# Patient Record
Sex: Male | Born: 1990 | Race: White | Hispanic: No | Marital: Single | Smoking: Current some day smoker
Health system: Southern US, Community
[De-identification: ages and names within clinical notes are randomized; demographics above are authoritative.]

## PROBLEM LIST (undated history)

## (undated) ENCOUNTER — Emergency Department (HOSPITAL_COMMUNITY): Discharge status: Against Medical Advice | Payer: Self-pay

---

## 2009-10-29 ENCOUNTER — Emergency Department (HOSPITAL_BASED_OUTPATIENT_CLINIC_OR_DEPARTMENT_OTHER): Admission: EM | Admit: 2009-10-29 | Discharge: 2009-10-29 | Payer: Self-pay | Admitting: Emergency Medicine

## 2010-06-21 ENCOUNTER — Emergency Department (HOSPITAL_BASED_OUTPATIENT_CLINIC_OR_DEPARTMENT_OTHER): Admission: EM | Admit: 2010-06-21 | Discharge: 2010-06-22 | Payer: Self-pay | Admitting: Emergency Medicine

## 2010-06-27 ENCOUNTER — Emergency Department (HOSPITAL_BASED_OUTPATIENT_CLINIC_OR_DEPARTMENT_OTHER)
Admission: EM | Admit: 2010-06-27 | Discharge: 2010-06-27 | Payer: Self-pay | Source: Home / Self Care | Admitting: Emergency Medicine

## 2010-10-11 LAB — URINALYSIS, ROUTINE W REFLEX MICROSCOPIC
Bilirubin Urine: NEGATIVE
Hgb urine dipstick: NEGATIVE
Ketones, ur: NEGATIVE mg/dL
Urobilinogen, UA: 1 mg/dL (ref 0.0–1.0)

## 2011-05-30 ENCOUNTER — Encounter: Payer: Self-pay | Admitting: Emergency Medicine

## 2011-05-30 ENCOUNTER — Emergency Department (HOSPITAL_COMMUNITY)
Admission: EM | Admit: 2011-05-30 | Discharge: 2011-05-30 | Disposition: A | Discharge status: Home / Self Care | Payer: Self-pay | Attending: Emergency Medicine | Admitting: Emergency Medicine

## 2011-05-30 DIAGNOSIS — S0501XA Injury of conjunctiva and corneal abrasion without foreign body, right eye, initial encounter: Secondary | ICD-10-CM

## 2011-05-30 DIAGNOSIS — S058X9A Other injuries of unspecified eye and orbit, initial encounter: Secondary | ICD-10-CM | POA: Insufficient documentation

## 2011-05-30 DIAGNOSIS — Y9383 Activity, rough housing and horseplay: Secondary | ICD-10-CM | POA: Insufficient documentation

## 2011-05-30 DIAGNOSIS — H11419 Vascular abnormalities of conjunctiva, unspecified eye: Secondary | ICD-10-CM | POA: Insufficient documentation

## 2011-05-30 DIAGNOSIS — IMO0002 Reserved for concepts with insufficient information to code with codable children: Secondary | ICD-10-CM | POA: Insufficient documentation

## 2011-05-30 DIAGNOSIS — R51 Headache: Secondary | ICD-10-CM | POA: Insufficient documentation

## 2011-05-30 DIAGNOSIS — H5710 Ocular pain, unspecified eye: Secondary | ICD-10-CM | POA: Insufficient documentation

## 2011-05-30 DIAGNOSIS — F17200 Nicotine dependence, unspecified, uncomplicated: Secondary | ICD-10-CM | POA: Insufficient documentation

## 2011-05-30 MED ORDER — ERYTHROMYCIN 5 MG/GM OP OINT: TOPICAL_OINTMENT | Freq: Once | OPHTHALMIC | Status: AC

## 2011-05-30 MED ORDER — FLUORESCEIN SODIUM 1 MG OP STRP
1.0000 | ORAL_STRIP | Freq: Once | OPHTHALMIC | Status: AC
  Administered 2011-05-30: 1 via OPHTHALMIC

## 2011-05-30 MED ORDER — FLUORESCEIN SODIUM 1 MG OP STRP
ORAL_STRIP | OPHTHALMIC | Status: AC
  Administered 2011-05-30: 1 via OPHTHALMIC
  Filled 2011-05-30: qty 1

## 2011-05-30 MED ORDER — ERYTHROMYCIN 5 MG/GM OP OINT: TOPICAL_OINTMENT | Freq: Once | OPHTHALMIC | Status: DC

## 2011-05-30 NOTE — ED Notes (Signed)
Patient given discharge paperwork. Went over discharge instructions with patient.  Instructed patient to follow up with opthalmologist , to take prescriptions as directed, and to return to the ED for new, worsening, or concerning symptoms.

## 2011-05-30 NOTE — ED Provider Notes (Signed)
History     CSN: [id number] Arrival date & time: 05/30/2011  7:07 PM   First MD Initiated Contact with Patient 05/30/11 2128      Chief Complaint  Patient presents with  . Eye Injury    (Consider location/radiation/quality/duration/timing/severity/associated sxs/prior treatment) HPI Patient presents today as after sustaining an injury to his right eye. He notes that he was in his usual state of health prior to the injury. He recalls wrestling with his father, and his father's finger poking him in the right eye, with the immediate sensation of pain. Since that time his pain has ceased, and on presentation he has no eye pain. He transiently had a headache as well, but again has no headache on evaluation. The patient denies any focal visual changes, though there was transient double vision. No nausea, no current headache, no confusion, no other complaints. History reviewed. No pertinent past medical history.  History reviewed. No pertinent past surgical history.  No family history on file.  History  Substance Use Topics  . Smoking status: Current Some Day Smoker -- 0.2 packs/day for 3 years    Types: Cigarettes  . Smokeless tobacco: Not on file  . Alcohol Use: Yes     Drinks beer every weekend      Review of Systems  All other systems reviewed and are negative.    Allergies  Review of patient's allergies indicates no known allergies.  Home Medications  No current outpatient prescriptions on file.  BP 127/77  Pulse 96  Temp(Src) 98.1 F (36.7 C) (Oral)  Resp 18  SpO2 97%  Physical Exam  Constitutional: He appears well-developed and well-nourished. No distress.  HENT:  Head: Normocephalic and atraumatic.  Eyes:       Left thigh is normal Right eye has injection of the entire oh lateral aspect with trace inferior orbital injection. The area just inferior and lateral to the iris has a mild fluorescein uptake. No lid lesions. 20/20 vision in both eyes  Skin: He is  not diaphoretic.    ED Course  Procedures (including critical care time)  Labs Reviewed - No data to display No results found.   No diagnosis found.    MDM  This young male presents 2 days after suffering trauma to the right eye. On presentation the patient has no current complaints. His exam is notable for injection and mild fluorescein intake of the right lateral eye. Visual acuity is appropriate, and barring any other ongoing pain or complaints, the patient will be discharged with erythromycin ointment and ophthalmology followup tomorrow.        Gerhard Munch, MD 05/30/11 2216

## 2011-05-30 NOTE — ED Notes (Signed)
Patient is resting comfortably in eye room; no respiratory or acute distress noted.  Family present at bedside.  Updated patient on plan of care; informed patient that we are waiting for the EDP to evaluate patient.  Patient has no questions, concerns, or requests at this time.  Will continue to monitor.

## 2011-05-30 NOTE — ED Notes (Signed)
Tried to obtain Erythromycin ointment from peds pyxis; pyxis machine is empty of ointment.  Patient states that he does not want to wait and would rather have a prescription.  Dr. Jeraldine Loots notified.  Will continue to monitor.

## 2011-05-30 NOTE — ED Notes (Signed)
Dr.Lockwood at bedside  

## 2011-05-30 NOTE — ED Notes (Signed)
Pt st's someones finger went into his right eye 2 days ago, Pt c/o pain to right eye

## 2021-05-24 ENCOUNTER — Emergency Department
Admission: EM | Admit: 2021-05-24 | Discharge: 2021-05-24 | Disposition: A | Discharge status: Home / Self Care | Payer: No Typology Code available for payment source | Attending: Emergency Medicine | Admitting: Emergency Medicine

## 2021-05-24 ENCOUNTER — Emergency Department: Payer: No Typology Code available for payment source

## 2021-05-24 ENCOUNTER — Ambulatory Visit: Admit: 2021-05-24 | Discharge status: Absent without leave | Payer: Self-pay

## 2021-05-24 ENCOUNTER — Other Ambulatory Visit: Payer: Self-pay

## 2021-05-24 ENCOUNTER — Encounter: Payer: Self-pay | Admitting: Emergency Medicine

## 2021-05-24 DIAGNOSIS — Y990 Civilian activity done for income or pay: Secondary | ICD-10-CM | POA: Insufficient documentation

## 2021-05-24 DIAGNOSIS — F17210 Nicotine dependence, cigarettes, uncomplicated: Secondary | ICD-10-CM | POA: Diagnosis not present

## 2021-05-24 DIAGNOSIS — W228XXA Striking against or struck by other objects, initial encounter: Secondary | ICD-10-CM | POA: Insufficient documentation

## 2021-05-24 DIAGNOSIS — S022XXA Fracture of nasal bones, initial encounter for closed fracture: Secondary | ICD-10-CM | POA: Diagnosis not present

## 2021-05-24 DIAGNOSIS — S0990XA Unspecified injury of head, initial encounter: Secondary | ICD-10-CM | POA: Diagnosis present

## 2021-05-24 MED ORDER — HYDROCODONE-ACETAMINOPHEN 5-325 MG PO TABS: 1.0000 | ORAL_TABLET | Freq: Three times a day (TID) | ORAL | 0 refills | Status: AC | PRN

## 2021-05-24 NOTE — ED Notes (Signed)
D/C and f/up discussed with f/up. Pt educated to take tylenol and motrin before percocet. Pt ambulatory on D/C. NAD noted.

## 2021-05-24 NOTE — ED Provider Notes (Signed)
[facility] Emergency Department Provider Note ____________________________________________  Time seen: 1645  I have reviewed the triage vital signs and the nursing notes.  HISTORY  Chief Complaint  Facial Injury   HPI Shawn Mcgrath is a 30 y.o. male presents to the ED for evaluation of work-related injury.  Patient was using a cordless drill, the drill bit got stuck and pieces still he was drilling into.  This caused the handle and crank to spin around and smacked the patient in the nose.  He denies any LOC or nose bleed.  He presents at this time with ongoing nose pain and some subtle deformity.  He denies any other injury at this time.  History reviewed. No pertinent past medical history.  There are no problems to display for this patient.   History reviewed. No pertinent surgical history.  Prior to Admission medications   Medication Sig Start Date End Date Taking? Authorizing Provider  HYDROcodone-acetaminophen (NORCO) 5-325 MG tablet Take 1 tablet by mouth 3 (three) times daily as needed for up to 3 days. 05/24/21 05/27/21 Yes Hanif Radin, Charlesetta Ivory, PA-C    Allergies Patient has no known allergies.  History reviewed. No pertinent family history.  Social History Social History   Tobacco Use   Smoking status: Some Days    Packs/day: 0.25    Years: 3.00    Pack years: 0.75    Types: Cigarettes  Substance Use Topics   Alcohol use: Yes    Comment: Drinks beer every weekend   Drug use: No    Review of Systems  Constitutional: Negative for fever. Eyes: Negative for visual changes. ENT: Negative for sore throat. Cardiovascular: Negative for chest pain. Respiratory: Negative for shortness of breath. Gastrointestinal: Negative for abdominal pain, vomiting and diarrhea. Genitourinary: Negative for dysuria. Musculoskeletal: Negative for back pain. Skin: Negative for rash. Neurological: Negative for headaches, focal weakness or  numbness. ____________________________________________  PHYSICAL EXAM:  VITAL SIGNS: ED Triage Vitals  Enc Vitals Group     BP 05/24/21 1349 (!) 138/98     Pulse Rate 05/24/21 1349 62     Resp 05/24/21 1349 15     Temp 05/24/21 1349 97.9 F (36.6 C)     Temp Source 05/24/21 1349 Oral     SpO2 05/24/21 1349 98 %     Weight 05/24/21 1350 180 lb (81.6 kg)     Height 05/24/21 1350 5\' 4"  (1.626 m)     Head Circumference --      Peak Flow --      Pain Score 05/24/21 1355 2     Pain Loc --      Pain Edu? --      Excl. in GC? --     Constitutional: Alert and oriented. Well appearing and in no distress. Head: Normocephalic and atraumatic.  No raccoon eyes noted Eyes: Conjunctivae are normal. PERRL. Normal extraocular movements Nose: No congestion/rhinorrhea/epistaxis.  Mild septal deviation appreciated with some early bruising noted to the right side of the nasal bridge.  No septal hematoma appreciated Mouth/Throat: Mucous membranes are moist. Cardiovascular: Normal rate, regular rhythm. Normal distal pulses. Respiratory: Normal respiratory effort. No wheezes/rales/rhonchi. Musculoskeletal: Nontender with normal range of motion in all extremities.  Neurologic:  Normal gait without ataxia. Normal speech and language. No gross focal neurologic deficits are appreciated. Skin:  Skin is warm, dry and intact. No rash noted. Psychiatric: Mood and affect are normal. Patient exhibits appropriate insight and judgment. ____________________________________________    {LABS (pertinent  positives/negatives)  ____________________________________________  {EKG  ____________________________________________   RADIOLOGY Official radiology report(s): CT Maxillofacial Wo Contrast  Result Date: 05/24/2021 CLINICAL DATA:  Nose injury today, nasal fracture suspected EXAM: CT MAXILLOFACIAL WITHOUT CONTRAST TECHNIQUE: Multidetector CT imaging of the maxillofacial structures was performed. Multiplanar  CT image reconstructions were also generated. COMPARISON:  None. FINDINGS: Osseous: Acute mildly depressed right nasal bone fracture. The nasal septum is midline. No additional fracture of the zygomatic arches, mandibles, or maxilla. The temporomandibular joints are congruent. Orbits: No acute orbital fracture.  Both globes are intact. Sinuses: No sinus fracture or fluid level. Paranasal sinuses are clear. Included mastoid air cells are clear. Soft tissues: Mild nasal soft tissue edema. Limited intracranial: No significant or unexpected finding. IMPRESSION: Acute mildly depressed right nasal bone fracture. Electronically Signed   By: Narda Rutherford M.D.   On: 05/24/2021 18:30   ____________________________________________  PROCEDURES   Procedures ____________________________________________   INITIAL IMPRESSION / ASSESSMENT AND PLAN / ED COURSE  As part of my medical decision making, I reviewed the following data within the electronic MEDICAL RECORD NUMBER Radiograph reviewed as noted, Notes from prior ED visits, and [place] Controlled Substance Database  Patient with ED evaluation and management of a nasal contusion resulting in a right nasal bone fracture, confirmed on CT imaging.  Patient otherwise is stable without signs of a closed head injury or active epistaxis.  [name] was evaluated in Emergency Department on 05/24/2021 for the symptoms described in the history of present illness. He was evaluated in the context of the global COVID-19 pandemic, which necessitated consideration that the patient might be at risk for infection with the SARS-CoV-2 virus that causes COVID-19. Institutional protocols and algorithms that pertain to the evaluation of patients at risk for COVID-19 are in a state of rapid change based on information released by regulatory bodies including the CDC and federal and state organizations. These policies and algorithms were followed during the patient's care in the ED.  I  reviewed the patient's prescription history over the last 12 months in the multi-state controlled substances database(s) that includes [place], [place], [place], [place], [place], [place], [place], [place], [place], [place], [place], [place], [place], and [place].  Results were notable for no current RX. ____________________________________________  FINAL CLINICAL IMPRESSION(S) / ED DIAGNOSES  Final diagnoses:  Closed fracture of nasal bone, initial encounter      Lissa Hoard, PA-C 05/24/21 1852    Arnaldo Natal, MD 05/24/21 2105

## 2021-05-24 NOTE — Discharge Instructions (Addendum)
You may apply ice to reduce swelling.  Expect bruising to develop over the next several days.  Take over-the-counter Tylenol or Motrin for nondrowsy pain relief, take the pain medicine as needed for moderate to severe pain.  Follow-up with [facility] for ongoing evaluation and management of your mildly displaced nasal fracture.

## 2021-05-24 NOTE — ED Triage Notes (Signed)
Pt comes into the ED via POV c/o nose injury today while working.  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 Pt has no obvious deformity to the nose at this time and no epistaxis present. Workers comp through [employer] and [employer].  Marijean Niemann, with the company called to determine workers comp needs, but no answer by company [phone]).

## 2022-07-31 ENCOUNTER — Encounter (HOSPITAL_BASED_OUTPATIENT_CLINIC_OR_DEPARTMENT_OTHER): Payer: Self-pay

## 2022-07-31 DIAGNOSIS — G4730 Sleep apnea, unspecified: Secondary | ICD-10-CM

## 2022-08-01 ENCOUNTER — Ambulatory Visit (HOSPITAL_BASED_OUTPATIENT_CLINIC_OR_DEPARTMENT_OTHER): Payer: No Typology Code available for payment source | Attending: Physician Assistant | Admitting: Internal Medicine

## 2022-08-01 VITALS — Ht 64.0 in | Wt 177.0 lb

## 2022-08-01 DIAGNOSIS — G4733 Obstructive sleep apnea (adult) (pediatric): Secondary | ICD-10-CM | POA: Diagnosis not present

## 2022-08-01 DIAGNOSIS — R5383 Other fatigue: Secondary | ICD-10-CM | POA: Insufficient documentation

## 2022-08-01 DIAGNOSIS — R400 Somnolence: Secondary | ICD-10-CM | POA: Diagnosis not present

## 2022-08-01 DIAGNOSIS — R0683 Snoring: Secondary | ICD-10-CM | POA: Diagnosis present

## 2022-08-01 DIAGNOSIS — G4730 Sleep apnea, unspecified: Secondary | ICD-10-CM

## 2022-08-05 DIAGNOSIS — G4730 Sleep apnea, unspecified: Secondary | ICD-10-CM

## 2022-08-05 NOTE — Procedures (Signed)
Patient Name: Shawn Mcgrath, Shawn Mcgrath Date: 08/01/2022 Gender: Male D.O.B: 1990/11/15 Age (years): 31 Referring Provider: Berton Lan Height (inches): 69 Interpreting Physician: Baird Lyons MD, ABSM Weight (lbs): 177 RPSGT: Laren Everts BMI: 30 MRN: [id number] Neck Size: 15.50  CLINICAL INFORMATION Sleep Study Type: Split Night CPAP Indication for sleep study: Excessive Daytime Sleepiness, Fatigue, Morning Headaches, Obesity, Sleep walking/talking/parasomnias, Snoring, Witnessed Apneas Epworth Sleepiness Score: 11  SLEEP STUDY TECHNIQUE As per the AASM Manual for the Scoring of Sleep and Associated Events v2.3 (April 2016) with a hypopnea requiring 4% desaturations.  The channels recorded and monitored were frontal, central and occipital EEG, electrooculogram (EOG), submentalis EMG (chin), nasal and oral airflow, thoracic and abdominal wall motion, anterior tibialis EMG, snore microphone, electrocardiogram, and pulse oximetry. Continuous positive airway pressure (CPAP) was initiated when the patient met split night criteria and was titrated according to treat sleep-disordered breathing.  MEDICATIONS Medications self-administered by patient taken the night of the study : LUNESTA  RESPIRATORY PARAMETERS Diagnostic  Total AHI (/hr): 23.7 RDI (/hr): 33.9 OA Index (/hr): 1 CA Index (/hr): 0.0 REM AHI (/hr): 72.0 NREM AHI (/hr): 17.1 Supine AHI (/hr): 23.2 Non-supine AHI (/hr): 36.1 Min O2 Sat (%): 83.0 Mean O2 (%): 92.5 Time below 88% (min): 3.4   Titration  Optimal Pressure (cm): 12 AHI at Optimal Pressure (/hr): 0 Min O2 at Optimal Pressure (%): 93.0 Supine % at Optimal (%): 46 Sleep % at Optimal (%): 97   SLEEP ARCHITECTURE The recording time for the entire night was 413.1 minutes.  During a baseline period of 187.7 minutes, the patient slept for 124.0 minutes in REM and nonREM, yielding a sleep efficiency of 66.1%. Sleep onset after lights out was 61.0 minutes with  a REM latency of 77.5 minutes. The patient spent 9.3% of the night in stage N1 sleep, 78.6% in stage N2 sleep, 0.0% in stage N3 and 12.1% in REM.  During the titration period of 220.0 minutes, the patient slept for 202.5 minutes in REM and nonREM, yielding a sleep efficiency of 92.0%. Sleep onset after CPAP initiation was 9.4 minutes with a REM latency of 71.0 minutes. The patient spent 4.7% of the night in stage N1 sleep, 72.8% in stage N2 sleep, 0.0% in stage N3 and 22.5% in REM.  CARDIAC DATA The 2 lead EKG demonstrated sinus rhythm. The mean heart rate was 100.0 beats per minute. Other EKG findings include: None.  LEG MOVEMENT DATA The total Periodic Limb Movements of Sleep (PLMS) were 0. The PLMS index was 0.0 .  IMPRESSIONS - Moderate obstructive sleep apnea occurred during the diagnostic portion of the study(AHI = 23.7/hour). An optimal PAP pressure was selected for this patient (12 cm of water) - No significant central sleep apnea occurred during the diagnostic portion of the study (CAI = 0.0/hour). - The patient had minimal or no oxygen desaturation during the diagnostic portion of the study (Min O2 = 83.0%). Minimum O2 saturation on CPAP 12 was 93%. - The patient snored with moderate snoring volume during the diagnostic portion of the study. - No cardiac abnormalities were noted during this study. - Clinically significant periodic limb movements did not occur during sleep.  DIAGNOSIS - Obstructive Sleep Apnea (G47.33)  RECOMMENDATIONS - Trial of CPAP therapy on 12 cm H2O or autopap 5-15. - Patient used a Medium size Fisher[employer]Paykel Full Face Vitera mask and heated humidification. - Be careful with alcohol, sedatives and other CNS depressants that may worsen sleep apnea and disrupt normal sleep  architecture. - Sleep hygiene should be reviewed to assess factors that may improve sleep quality. - Weight management and regular exercise should be initiated or continued.  [Electronically  signed] 08/05/2022 10:41 AM  Baird Lyons MD, ABSM Diplomate, American Board of Sleep Medicine NPI: [id number]                         [place]  ELECTRONICALLY SIGNED ON:  08/05/2022, 10:42 AM [place] PH: [phone]   FX: [phone] [place]

## 2022-12-29 IMAGING — CT CT MAXILLOFACIAL W/O CM
3 series · 15 of 47 positions shown, 18 images · non-contrast
Comparison: None.

CLINICAL DATA: Nose injury today, nasal fracture suspected

EXAM:
CT MAXILLOFACIAL WITHOUT CONTRAST
TECHNIQUE: Multidetector CT imaging of the maxillofacial structures was
performed. Multiplanar CT image reconstructions were also generated.

[Series 2: max soft · axial · 0.34mm/px · z∈[-210,-78]mm · 9 of 78 slices shown, 12 images]
[im 6/78  brain]
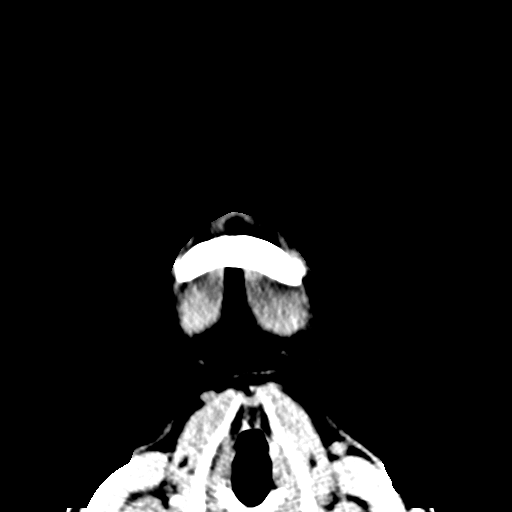
[im 6/78  bone]
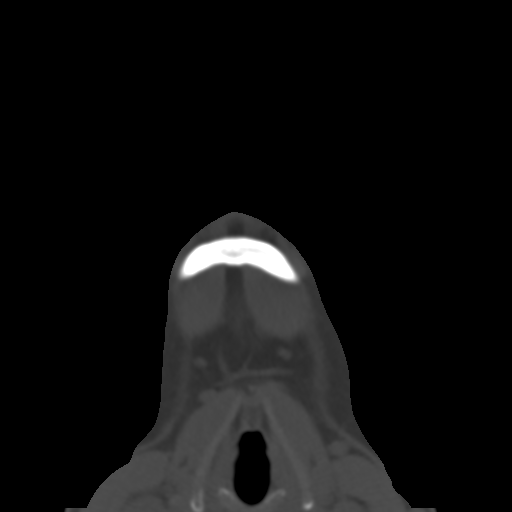
[im 14/78  bone]
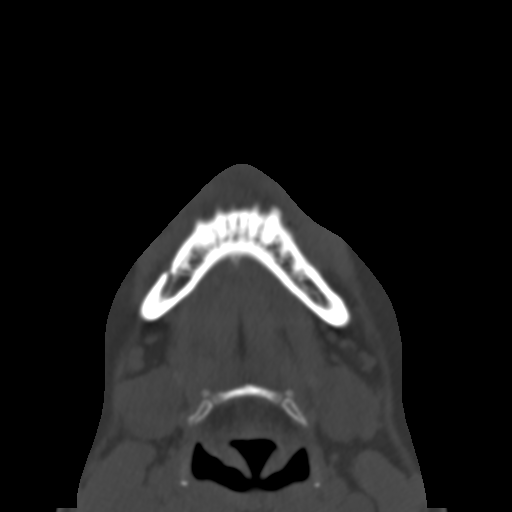
[im 22/78  bone]
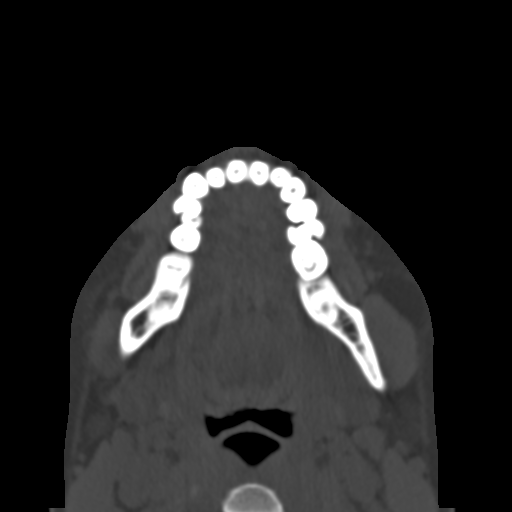
[im 30/78  bone]
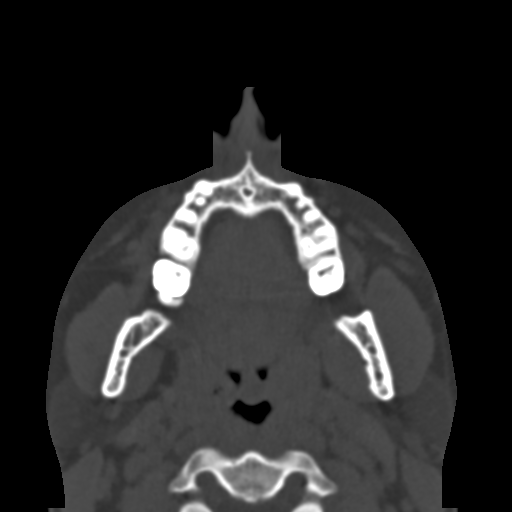
[im 40/78  brain]
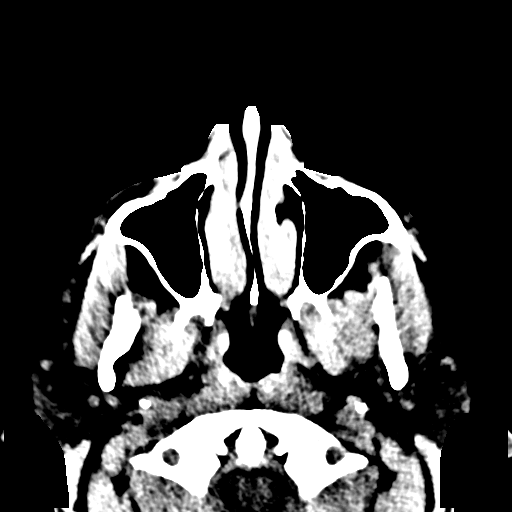
[im 40/78  bone]
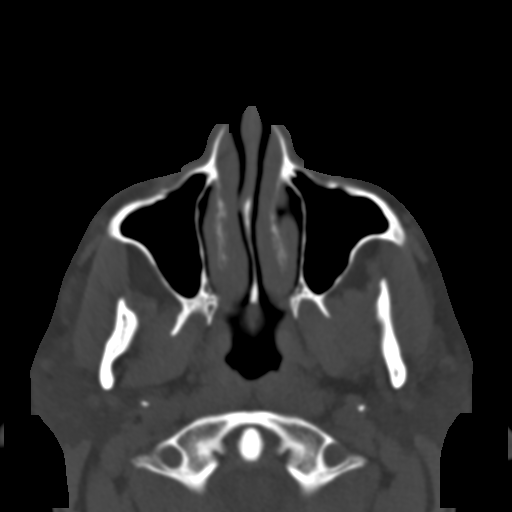
[im 48/78  bone]
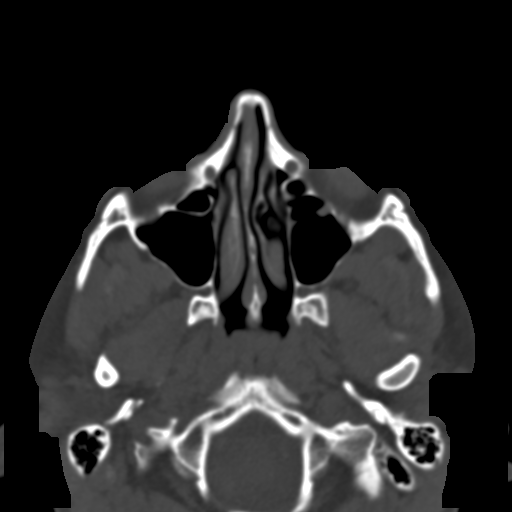
[im 56/78  bone]
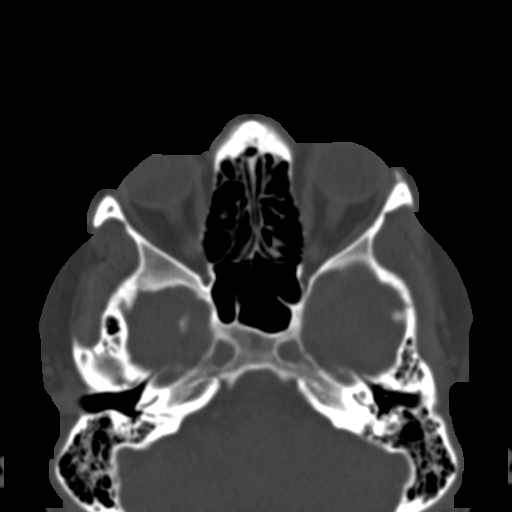
[im 64/78  bone]
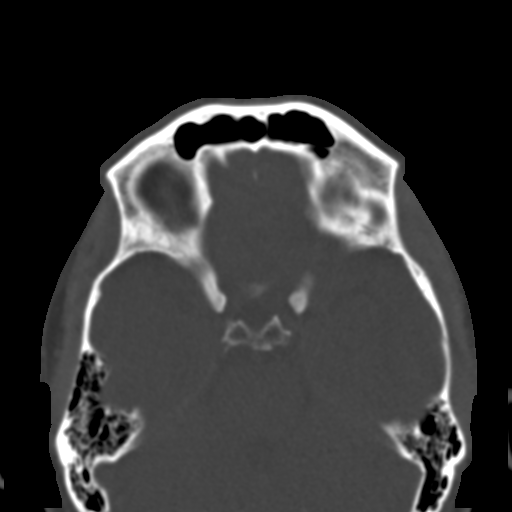
[im 72/78  brain]
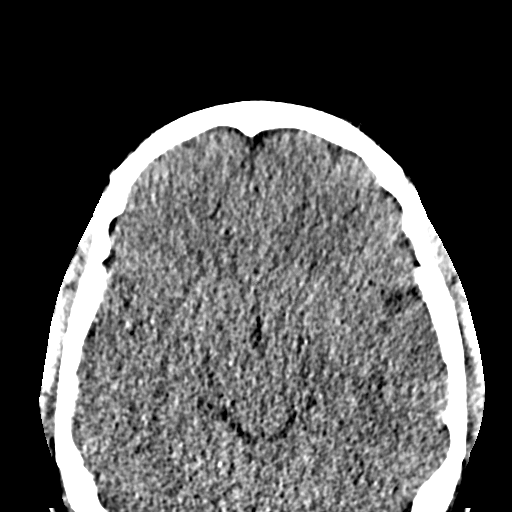
[im 72/78  bone]
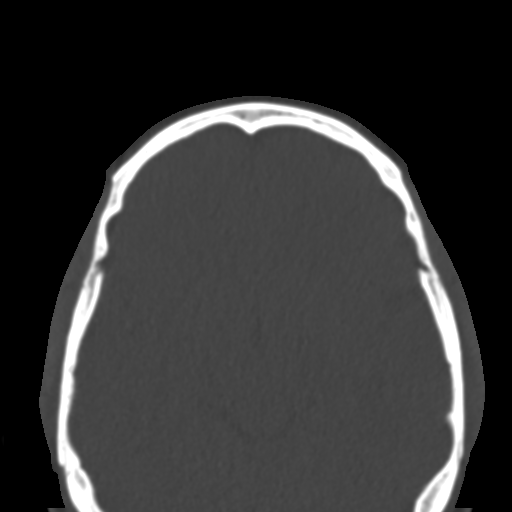

[Series 6: coronal soft · coronal · 0.37mm/px · 3 of 78 slices shown]
[im 26/78  bone]
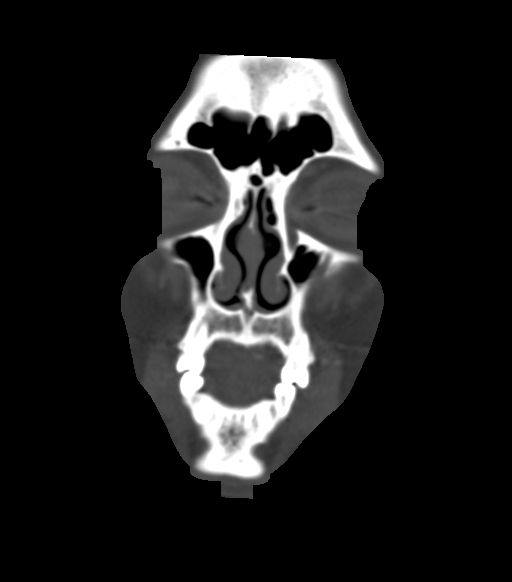
[im 35/78  bone]
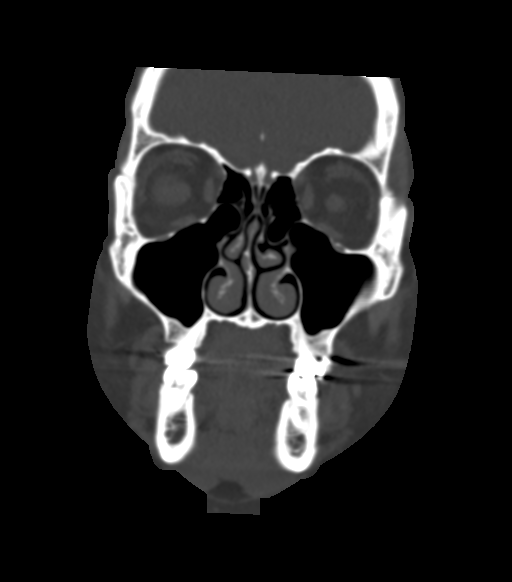
[im 43/78  bone]
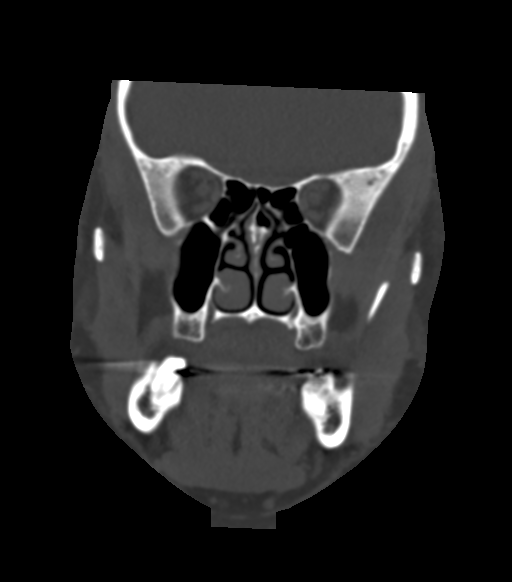

[Series 9: sagittal soft · sagittal · 0.31mm/px · 3 of 95 slices shown]
[im 32/95  bone]
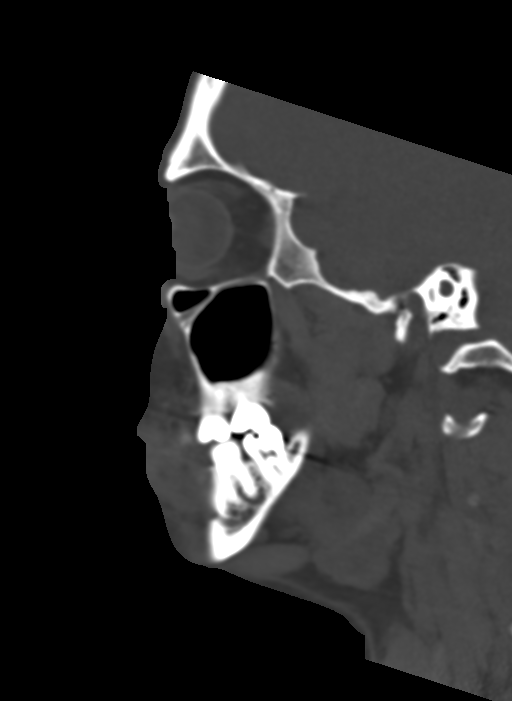
[im 48/95  bone]
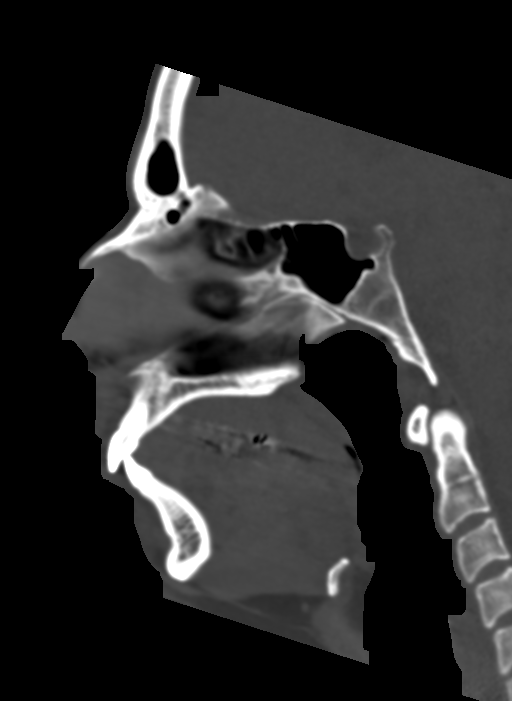
[im 63/95  bone]
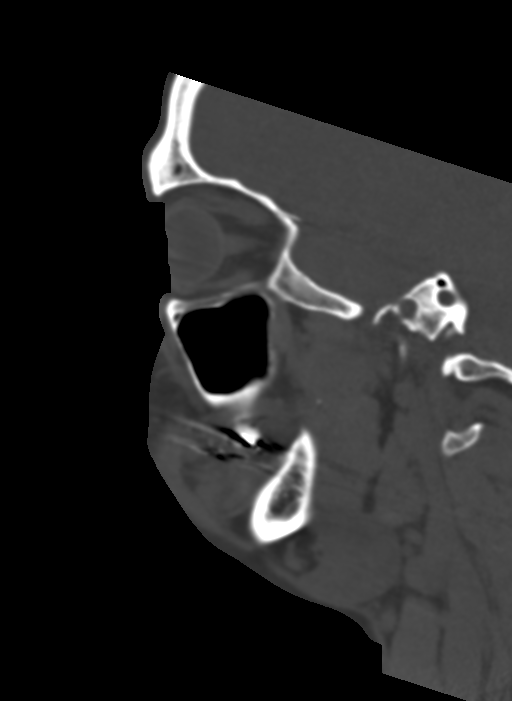

[15 of 47 positions shown; findings below may reference images not displayed]

FINDINGS: Osseous: Acute mildly depressed right nasal bone fracture. The nasal
septum is midline. No additional fracture of the zygomatic arches,
mandibles, or maxilla. The temporomandibular joints are congruent.

Orbits: No acute orbital fracture.  Both globes are intact.

Sinuses: No sinus fracture or fluid level. Paranasal sinuses are
clear. Included mastoid air cells are clear.

Soft tissues: Mild nasal soft tissue edema.

Limited intracranial: No significant or unexpected finding.
IMPRESSION: Acute mildly depressed right nasal bone fracture.
